# Patient Record
Sex: Male | Born: 1940 | Race: Black or African American | Hispanic: No | Marital: Married | Smoking: Current every day smoker
Health system: Southern US, Community
[De-identification: ages and names within clinical notes are randomized; demographics above are authoritative.]

## PROBLEM LIST (undated history)

## (undated) DIAGNOSIS — E7800 Pure hypercholesterolemia, unspecified: Secondary | ICD-10-CM

---

## 2009-04-09 ENCOUNTER — Inpatient Hospital Stay: Payer: Self-pay | Admitting: Specialist

## 2015-12-19 DIAGNOSIS — Z6832 Body mass index (BMI) 32.0-32.9, adult: Secondary | ICD-10-CM | POA: Diagnosis not present

## 2015-12-19 DIAGNOSIS — F17200 Nicotine dependence, unspecified, uncomplicated: Secondary | ICD-10-CM | POA: Diagnosis not present

## 2016-04-18 DIAGNOSIS — R42 Dizziness and giddiness: Secondary | ICD-10-CM | POA: Insufficient documentation

## 2016-04-18 DIAGNOSIS — R030 Elevated blood-pressure reading, without diagnosis of hypertension: Secondary | ICD-10-CM | POA: Insufficient documentation

## 2016-04-18 DIAGNOSIS — F17200 Nicotine dependence, unspecified, uncomplicated: Secondary | ICD-10-CM | POA: Diagnosis not present

## 2016-04-18 DIAGNOSIS — E669 Obesity, unspecified: Secondary | ICD-10-CM | POA: Diagnosis not present

## 2016-04-18 DIAGNOSIS — M6281 Muscle weakness (generalized): Secondary | ICD-10-CM | POA: Diagnosis not present

## 2016-05-01 DIAGNOSIS — E669 Obesity, unspecified: Secondary | ICD-10-CM | POA: Diagnosis not present

## 2016-05-01 DIAGNOSIS — F17200 Nicotine dependence, unspecified, uncomplicated: Secondary | ICD-10-CM | POA: Diagnosis not present

## 2016-05-01 DIAGNOSIS — R42 Dizziness and giddiness: Secondary | ICD-10-CM | POA: Diagnosis not present

## 2016-05-07 DIAGNOSIS — M6281 Muscle weakness (generalized): Secondary | ICD-10-CM | POA: Diagnosis not present

## 2016-05-07 DIAGNOSIS — R29898 Other symptoms and signs involving the musculoskeletal system: Secondary | ICD-10-CM | POA: Diagnosis not present

## 2016-05-07 DIAGNOSIS — R9089 Other abnormal findings on diagnostic imaging of central nervous system: Secondary | ICD-10-CM | POA: Diagnosis not present

## 2016-05-07 DIAGNOSIS — R9082 White matter disease, unspecified: Secondary | ICD-10-CM | POA: Diagnosis not present

## 2016-05-07 DIAGNOSIS — Z8673 Personal history of transient ischemic attack (TIA), and cerebral infarction without residual deficits: Secondary | ICD-10-CM | POA: Diagnosis not present

## 2016-05-09 DIAGNOSIS — R1310 Dysphagia, unspecified: Secondary | ICD-10-CM | POA: Diagnosis not present

## 2016-05-09 DIAGNOSIS — R7309 Other abnormal glucose: Secondary | ICD-10-CM | POA: Diagnosis not present

## 2016-05-09 DIAGNOSIS — R030 Elevated blood-pressure reading, without diagnosis of hypertension: Secondary | ICD-10-CM | POA: Diagnosis not present

## 2016-05-09 DIAGNOSIS — F17200 Nicotine dependence, unspecified, uncomplicated: Secondary | ICD-10-CM | POA: Diagnosis not present

## 2016-05-09 DIAGNOSIS — M6281 Muscle weakness (generalized): Secondary | ICD-10-CM | POA: Diagnosis not present

## 2016-07-29 ENCOUNTER — Observation Stay
Admission: EM | Admit: 2016-07-29 | Discharge: 2016-07-29 | Disposition: A | Discharge status: Home / Self Care | Payer: Medicare HMO | Attending: Internal Medicine | Admitting: Internal Medicine

## 2016-07-29 ENCOUNTER — Emergency Department: Payer: Medicare HMO

## 2016-07-29 ENCOUNTER — Encounter: Payer: Self-pay | Admitting: Emergency Medicine

## 2016-07-29 DIAGNOSIS — E7800 Pure hypercholesterolemia, unspecified: Secondary | ICD-10-CM | POA: Insufficient documentation

## 2016-07-29 DIAGNOSIS — I471 Supraventricular tachycardia: Secondary | ICD-10-CM | POA: Diagnosis not present

## 2016-07-29 DIAGNOSIS — F17210 Nicotine dependence, cigarettes, uncomplicated: Secondary | ICD-10-CM | POA: Insufficient documentation

## 2016-07-29 DIAGNOSIS — Z79899 Other long term (current) drug therapy: Secondary | ICD-10-CM | POA: Diagnosis not present

## 2016-07-29 DIAGNOSIS — E669 Obesity, unspecified: Secondary | ICD-10-CM | POA: Insufficient documentation

## 2016-07-29 DIAGNOSIS — Z823 Family history of stroke: Secondary | ICD-10-CM | POA: Diagnosis not present

## 2016-07-29 DIAGNOSIS — Z7982 Long term (current) use of aspirin: Secondary | ICD-10-CM | POA: Diagnosis not present

## 2016-07-29 DIAGNOSIS — D72829 Elevated white blood cell count, unspecified: Secondary | ICD-10-CM | POA: Diagnosis not present

## 2016-07-29 DIAGNOSIS — Z8249 Family history of ischemic heart disease and other diseases of the circulatory system: Secondary | ICD-10-CM | POA: Insufficient documentation

## 2016-07-29 DIAGNOSIS — R000 Tachycardia, unspecified: Secondary | ICD-10-CM | POA: Diagnosis not present

## 2016-07-29 DIAGNOSIS — D729 Disorder of white blood cells, unspecified: Secondary | ICD-10-CM | POA: Diagnosis not present

## 2016-07-29 DIAGNOSIS — I700 Atherosclerosis of aorta: Secondary | ICD-10-CM | POA: Diagnosis not present

## 2016-07-29 DIAGNOSIS — E860 Dehydration: Secondary | ICD-10-CM | POA: Insufficient documentation

## 2016-07-29 DIAGNOSIS — Z6833 Body mass index (BMI) 33.0-33.9, adult: Secondary | ICD-10-CM | POA: Insufficient documentation

## 2016-07-29 DIAGNOSIS — E785 Hyperlipidemia, unspecified: Secondary | ICD-10-CM | POA: Diagnosis not present

## 2016-07-29 DIAGNOSIS — Z809 Family history of malignant neoplasm, unspecified: Secondary | ICD-10-CM | POA: Diagnosis not present

## 2016-07-29 DIAGNOSIS — Z716 Tobacco abuse counseling: Secondary | ICD-10-CM | POA: Diagnosis not present

## 2016-07-29 HISTORY — DX: Pure hypercholesterolemia, unspecified: E78.00

## 2016-07-29 LAB — COMPREHENSIVE METABOLIC PANEL
ALT: 13 U/L — ABNORMAL LOW (ref 17–63)
ANION GAP: 11 (ref 5–15)
AST: 25 U/L (ref 15–41)
Albumin: 4 g/dL (ref 3.5–5.0)
Alkaline Phosphatase: 80 U/L (ref 38–126)
BUN: 18 mg/dL (ref 6–20)
CHLORIDE: 97 mmol/L — AB (ref 101–111)
CO2: 26 mmol/L (ref 22–32)
Calcium: 9.1 mg/dL (ref 8.9–10.3)
Creatinine, Ser: 1.58 mg/dL — ABNORMAL HIGH (ref 0.61–1.24)
GFR, EST AFRICAN AMERICAN: 48 mL/min — AB (ref >60)
GFR, EST NON AFRICAN AMERICAN: 41 mL/min — AB (ref >60)
Glucose, Bld: 170 mg/dL — ABNORMAL HIGH (ref 65–99)
POTASSIUM: 4.3 mmol/L (ref 3.5–5.1)
SODIUM: 134 mmol/L — AB (ref 135–145)
Total Bilirubin: 0.7 mg/dL (ref 0.3–1.2)
Total Protein: 7.4 g/dL (ref 6.5–8.1)

## 2016-07-29 LAB — CBC WITH DIFFERENTIAL/PLATELET
Basophils Absolute: 0.1 10*3/uL (ref 0–0.1)
Basophils Relative: 1 %
EOS ABS: 0.5 10*3/uL (ref 0–0.7)
EOS PCT: 3 %
HCT: 42.3 % (ref 40.0–52.0)
Hemoglobin: 13.9 g/dL (ref 13.0–18.0)
LYMPHS ABS: 2.9 10*3/uL (ref 1.0–3.6)
LYMPHS PCT: 16 %
MCH: 28.2 pg (ref 26.0–34.0)
MCHC: 32.7 g/dL (ref 32.0–36.0)
MCV: 86.2 fL (ref 80.0–100.0)
MONO ABS: 1 10*3/uL (ref 0.2–1.0)
Monocytes Relative: 6 %
Neutro Abs: 13.8 10*3/uL — ABNORMAL HIGH (ref 1.4–6.5)
Neutrophils Relative %: 74 %
PLATELETS: 285 10*3/uL (ref 150–440)
RBC: 4.91 MIL/uL (ref 4.40–5.90)
RDW: 15.6 % — AB (ref 11.5–14.5)
WBC: 18.4 10*3/uL — ABNORMAL HIGH (ref 3.8–10.6)

## 2016-07-29 LAB — LIPID PANEL
CHOL/HDL RATIO: 3.7 ratio
CHOLESTEROL: 137 mg/dL (ref 0–200)
HDL: 37 mg/dL — AB (ref >40)
LDL Cholesterol: 87 mg/dL (ref 0–99)
Triglycerides: 66 mg/dL (ref <150)
VLDL: 13 mg/dL (ref 0–40)

## 2016-07-29 LAB — MAGNESIUM: MAGNESIUM: 1.6 mg/dL — AB (ref 1.7–2.4)

## 2016-07-29 LAB — INFLUENZA PANEL BY PCR (TYPE A & B)
INFLAPCR: NEGATIVE
INFLBPCR: NEGATIVE

## 2016-07-29 LAB — TROPONIN I
TROPONIN I: 0.04 ng/mL — AB (ref <0.03)
Troponin I: 0.05 ng/mL (ref <0.03)

## 2016-07-29 LAB — TSH: TSH: 1.185 u[IU]/mL (ref 0.350–4.500)

## 2016-07-29 LAB — LACTIC ACID, PLASMA: LACTIC ACID, VENOUS: 1.5 mmol/L (ref 0.5–1.9)

## 2016-07-29 MED ORDER — METOPROLOL TARTRATE 5 MG/5ML IV SOLN
2.5000 mg | Freq: Once | INTRAVENOUS | Status: AC
  Administered 2016-07-29: 2.5 mg via INTRAVENOUS
  Filled 2016-07-29: qty 5

## 2016-07-29 MED ORDER — ASPIRIN EC 81 MG PO TBEC
81.0000 mg | DELAYED_RELEASE_TABLET | Freq: Every day | ORAL | Status: DC
  Administered 2016-07-29: 81 mg via ORAL
  Filled 2016-07-29: qty 1

## 2016-07-29 MED ORDER — SODIUM CHLORIDE 0.9 % IV SOLN: INTRAVENOUS | Status: DC

## 2016-07-29 MED ORDER — HEPARIN SODIUM (PORCINE) 5000 UNIT/ML IJ SOLN
5000.0000 [IU] | Freq: Three times a day (TID) | INTRAMUSCULAR | Status: DC
  Administered 2016-07-29: 5000 [IU] via SUBCUTANEOUS
  Filled 2016-07-29: qty 1

## 2016-07-29 MED ORDER — METOPROLOL TARTRATE 5 MG/5ML IV SOLN: 2.5000 mg | Freq: Once | INTRAVENOUS | Status: DC

## 2016-07-29 MED ORDER — ADENOSINE 6 MG/2ML IV SOLN
6.0000 mg | Freq: Once | INTRAVENOUS | Status: DC
Start: 2016-07-29 — End: 2016-07-29

## 2016-07-29 MED ORDER — SODIUM CHLORIDE 0.9 % IV SOLN
Freq: Once | INTRAVENOUS | Status: AC
Start: 2016-07-29 — End: 2016-07-29
  Administered 2016-07-29: 07:00:00 via INTRAVENOUS

## 2016-07-29 MED ORDER — ATORVASTATIN CALCIUM 20 MG PO TABS
20.0000 mg | ORAL_TABLET | Freq: Every day | ORAL | Status: DC
  Administered 2016-07-29: 20 mg via ORAL
  Filled 2016-07-29 (×2): qty 1

## 2016-07-29 MED ORDER — SODIUM CHLORIDE 0.9% FLUSH: 3.0000 mL | Freq: Two times a day (BID) | INTRAVENOUS | Status: DC

## 2016-07-29 MED ORDER — METOPROLOL TARTRATE 25 MG PO TABS: 12.5000 mg | ORAL_TABLET | Freq: Two times a day (BID) | ORAL | 0 refills | Status: AC

## 2016-07-29 MED ORDER — SODIUM CHLORIDE 0.9 % IV BOLUS (SEPSIS)
1000.0000 mL | Freq: Once | INTRAVENOUS | Status: AC
  Administered 2016-07-29: 1000 mL via INTRAVENOUS

## 2016-07-29 MED ORDER — MAGNESIUM SULFATE 2 GM/50ML IV SOLN
2.0000 g | Freq: Once | INTRAVENOUS | Status: AC
  Administered 2016-07-29: 2 g via INTRAVENOUS
  Filled 2016-07-29: qty 50

## 2016-07-29 NOTE — ED Provider Notes (Signed)
[facility] Emergency Department Provider Note    First MD Initiated Contact with Patient 07/29/16 0405     (approximate)  I have reviewed the triage vital signs and the nursing notes.   HISTORY  Chief Complaint Dizziness    HPI John Vang is a 76 y.o. male who presents with 3 hours of indigestion and dizziness. Patient arrives to the ER pale and mottled. Heart rate found to be in the 150s. He shouldn't take him back emergently to resuscitation bay. Had he had normal blood pressure and it was appropriately mentating but did appear poorly perfused. While obtaining IV access I did attempt left carotid massage which seemed to convert the patient from an SVT to a rapid tachycardia with prolonged PR interval.  He denies any chest pain. States his only symptom has been indigestion. No nausea or vomiting.   Past Medical History:  Diagnosis Date  . High cholesterol    History reviewed. No pertinent family history. History reviewed. No pertinent surgical history. There are no active problems to display for this patient.     Prior to Admission medications   Medication Sig Start Date End Date Taking? Authorizing Provider  aspirin EC 81 MG tablet Take 81 mg by mouth daily.   Yes Historical Provider, MD    Allergies Patient has no known allergies.    Social History Social History  Substance Use Topics  . Smoking status: Current Every Day Smoker    Packs/day: 1.00    Years: 50.00    Types: Cigarettes  . Smokeless tobacco: Never Used  . Alcohol use No    Review of Systems Patient denies headaches, rhinorrhea, blurry vision, numbness, shortness of breath, chest pain, edema, cough, abdominal pain, nausea, vomiting, diarrhea, dysuria, fevers, rashes or hallucinations unless otherwise stated above in HPI. ____________________________________________   PHYSICAL EXAM:  VITAL SIGNS: Vitals:   07/29/16 0359 07/29/16 0408  BP:  118/64  Pulse: (!)  153 (!) 145  Resp:  (!) 22  Temp:  97.4 F (36.3 C)    Constitutional: Alert and oriented. ill appearing and in moderate distress. Eyes: Conjunctivae are normal. PERRL. EOMI. Head: Atraumatic. Nose: No congestion/rhinnorhea. Mouth/Throat: Mucous membranes are moist.  Oropharynx non-erythematous. Neck: No stridor. Painless ROM. No cervical spine tenderness to palpation Hematological/Lymphatic/Immunilogical: No cervical lymphadenopathy. Cardiovascular: tachycardic, poorly perfused Grossly normal heart sounds.  Respiratory: Normal respiratory effort.  No retractions. Lungs CTAB. Gastrointestinal: Soft and nontender. No distention. No abdominal bruits. No CVA tenderness. Genitourinary:  Musculoskeletal: No lower extremity tenderness nor edema.  No joint effusions. Neurologic:  Normal speech and language. No gross focal neurologic deficits are appreciated. No gait instability. Skin:  Skin is warm, dry and intact. No rash noted. Psychiatric: Mood and affect are normal. Speech and behavior are normal.  ____________________________________________   LABS (all labs ordered are listed, but only abnormal results are displayed)  Results for orders placed or performed during the hospital encounter of 07/29/16 (from the past 24 hour(s))  CBC with Differential/Platelet     Status: Abnormal   Collection Time: 07/29/16  4:21 AM  Result Value Ref Range   WBC 18.4 (H) 3.8 - 10.6 K/uL   RBC 4.91 4.40 - 5.90 MIL/uL   Hemoglobin 13.9 13.0 - 18.0 g/dL   HCT 42.3 40.0 - 52.0 %   MCV 86.2 80.0 - 100.0 fL   MCH 28.2 26.0 - 34.0 pg   MCHC 32.7 32.0 - 36.0 g/dL   RDW 15.6 (H) 11.5 -  14.5 %   Platelets 285 150 - 440 K/uL   Neutrophils Relative % 74 %   Neutro Abs 13.8 (H) 1.4 - 6.5 K/uL   Lymphocytes Relative 16 %   Lymphs Abs 2.9 1.0 - 3.6 K/uL   Monocytes Relative 6 %   Monocytes Absolute 1.0 0.2 - 1.0 K/uL   Eosinophils Relative 3 %   Eosinophils Absolute 0.5 0 - 0.7 K/uL   Basophils Relative 1  %   Basophils Absolute 0.1 0 - 0.1 K/uL  Comprehensive metabolic panel     Status: Abnormal   Collection Time: 07/29/16  4:21 AM  Result Value Ref Range   Sodium 134 (L) 135 - 145 mmol/L   Potassium 4.3 3.5 - 5.1 mmol/L   Chloride 97 (L) 101 - 111 mmol/L   CO2 26 22 - 32 mmol/L   Glucose, Bld 170 (H) 65 - 99 mg/dL   BUN 18 6 - 20 mg/dL   Creatinine, Ser 1.58 (H) 0.61 - 1.24 mg/dL   Calcium 9.1 8.9 - 10.3 mg/dL   Total Protein 7.4 6.5 - 8.1 g/dL   Albumin 4.0 3.5 - 5.0 g/dL   AST 25 15 - 41 U/L   ALT 13 (L) 17 - 63 U/L   Alkaline Phosphatase 80 38 - 126 U/L   Total Bilirubin 0.7 0.3 - 1.2 mg/dL   GFR calc non Af Amer 41 (L) >60 mL/min   GFR calc Af Amer 48 (L) >60 mL/min   Anion gap 11 5 - 15  Troponin I     Status: None   Collection Time: 07/29/16  4:21 AM  Result Value Ref Range   Troponin I <0.03 <0.03 ng/mL  Magnesium     Status: Abnormal   Collection Time: 07/29/16  4:21 AM  Result Value Ref Range   Magnesium 1.6 (L) 1.7 - 2.4 mg/dL  Lactic acid, plasma     Status: None   Collection Time: 07/29/16  5:21 AM  Result Value Ref Range   Lactic Acid, Venous 1.5 0.5 - 1.9 mmol/L  Influenza panel by PCR (type A & B)     Status: None   Collection Time: 07/29/16  5:22 AM  Result Value Ref Range   Influenza A By PCR NEGATIVE NEGATIVE   Influenza B By PCR NEGATIVE NEGATIVE   ____________________________________________  EKG My review and personal interpretation at Time: 4:00   Indication: tachycardia  Rate: 150  Rhythm: svt Axis: left Other:  Irregularly irregular rhythm, non specific st changes likely rate dependent   My review and personal interpretation at Time: 4:16   Indication: tachycardia Rate: 100  Rhythm: sinus Axis: left Other: no stemi, prolonged PR interval, prolonged qt   My review and personal interpretation at Time: 04:22   Indication: tachycardia  Rate: 95  Rhythm: sinus dysrhythmia Axis: normal Other: prolonged pr, borderling prolonged  qt  ____________________________________________  RADIOLOGY  I personally reviewed all radiographic images ordered to evaluate for the above acute complaints and reviewed radiology reports and findings.  These findings were personally discussed with the patient.  Please see medical record for radiology report.  ____________________________________________   PROCEDURES  Procedure(s) performed:  Procedures    Critical Care performed: yes CRITICAL CARE Performed by: Merlyn Lot   Total critical care time: 40 minutes  Critical care time was exclusive of separately billable procedures and treating other patients.  Critical care was necessary to treat or prevent imminent or life-threatening deterioration.  Critical care was time spent  personally by me on the following activities: development of treatment plan with patient and/or surrogate as well as nursing, discussions with consultants, evaluation of patient's response to treatment, examination of patient, obtaining history from patient or surrogate, ordering and performing treatments and interventions, ordering and review of laboratory studies, ordering and review of radiographic studies, pulse oximetry and re-evaluation of patient's condition.  ____________________________________________   INITIAL IMPRESSION / ASSESSMENT AND PLAN / ED COURSE  Pertinent labs & imaging results that were available during my care of the patient were reviewed by me and considered in my medical decision making (see chart for details).  DDX: O abnormality, dehydration, sepsis, ACS, dysrhythmia   John Vang is a 76 y.o. who presents to the ED with SVT and critically ill appearing as described above.   Patient presented initially with SVT, narrow complex. Previous EKGs did not show any evidence of WPW. Patient without any history of A. fib or dysrhythmia. Patient surprisingly responded to carotid massage as well as IV fluids and Lopressor. Does  seem to be having some abnormal underlying sinus ectopic rhythm. Blood pressure otherwise normalizing of patient's much improved with rate control. Magnesium is low which was repleted. EKG showed no evidence of ST elevation MI. Troponin negative. Markedly elevated leukocytosis could be secondary to poor perfusion in the setting of dysrhythmia. No evidence of fever. Based on his age and no history of dysrhythmia with inadequate cardiology follow-up but do feel the patient would benefit from admission hospital for telemetry monitoring and further evaluation. Have discussed with the patient and available family all diagnostics and treatments performed thus far and all questions were answered to the best of my ability. The patient demonstrates understanding and agreement with plan.      ____________________________________________   FINAL CLINICAL IMPRESSION(S) / ED DIAGNOSES  Final diagnoses:  SVT (supraventricular tachycardia) (HCC)  Hypomagnesemia  Dehydration      NEW MEDICATIONS STARTED DURING THIS VISIT:  New Prescriptions   No medications on file     Note:  This document was prepared using Dragon voice recognition software and may include unintentional dictation errors.    Merlyn Lot, MD 07/29/16 [phone]

## 2016-07-29 NOTE — H&P (Signed)
[place] [facility] [place] NAME: John Vang    MR#:  [id number]  DATE OF BIRTH:  1940-11-25  DATE OF ADMISSION:  07/29/2016  PRIMARY CARE PHYSICIAN: Pcp Not In System   REQUESTING/REFERRING PHYSICIAN: Dr.Robinson  CHIEF COMPLAINT:   Chief Complaint  Patient presents with  . Dizziness    HISTORY OF PRESENT ILLNESS: John Vang  is a 76 y.o. male with a known history of Hypercholesterolemia, has been feeling palpitations on and off for last few month. This was not related to any activities and was getting resolved by taking some rest all the time. It was never associated with chest pain. Since last night patient started having this palpitation feeling along with some shortness of breath and sweating and it was not relieved by any efforts up until 3 to 4 am in the morning so he decided to come to emergency room. In ER he was noted to have supraventricular tachycardia which responded to metoprolol injection. Patient was also noted to have slightly higher creatinine, we don't have any baseline labs to compare with and his white blood cell count was high without any clear source of infection. He also does not have any cardiologist as outpatient to follow-up with so ER physician decided in pt's best interest to keep the patient for observation and do initial workup to rule out any major cardiac abnormalities. Patient denied any symptoms suggestive of any kind of infection.  PAST MEDICAL HISTORY:   Past Medical History:  Diagnosis Date  . High cholesterol     PAST SURGICAL HISTORY: History reviewed. No pertinent surgical history.  SOCIAL HISTORY:  Social History  Substance Use Topics  . Smoking status: Current Every Day Smoker    Packs/day: 1.00    Years: 50.00    Types: Cigarettes  . Smokeless tobacco: Never Used  . Alcohol use No    FAMILY HISTORY:  Family History  Problem Relation Age of Onset  . CAD Father   . Cancer Sister   . Stroke  Brother     DRUG ALLERGIES: No Known Allergies  REVIEW OF SYSTEMS:   CONSTITUTIONAL: No fever, fatigue or weakness.  EYES: No blurred or double vision.  EARS, NOSE, AND THROAT: No tinnitus or ear pain.  RESPIRATORY: No cough, shortness of breath, wheezing or hemoptysis.  CARDIOVASCULAR: No chest pain, orthopnea, edema. Had feeling of palpitation. GASTROINTESTINAL: No nausea, vomiting, diarrhea or abdominal pain.  GENITOURINARY: No dysuria, hematuria.  ENDOCRINE: No polyuria, nocturia,  HEMATOLOGY: No anemia, easy bruising or bleeding SKIN: No rash or lesion. MUSCULOSKELETAL: No joint pain or arthritis.   NEUROLOGIC: No tingling, numbness, weakness.  PSYCHIATRY: No anxiety or depression.   MEDICATIONS AT HOME:  Prior to Admission medications   Medication Sig Start Date End Date Taking? Authorizing Provider  aspirin EC 81 MG tablet Take 81 mg by mouth daily.   Yes Historical Provider, MD  atorvastatin (LIPITOR) 20 MG tablet Take 1 tablet by mouth daily. 07/04/16  Yes Historical Provider, MD      PHYSICAL EXAMINATION:   VITAL SIGNS: Blood pressure 122/64, pulse 72, temperature 97.4 F (36.3 C), temperature source Oral, resp. rate 17, height 5\' 5"  (1.651 m), weight 95.3 kg (210 lb), SpO2 97 %.  GENERAL:  76 y.o.-year-old patient lying in the bed with no acute distress.  EYES: Pupils equal, round, reactive to light and accommodation. No scleral icterus. Extraocular muscles intact.  HEENT: Head atraumatic, normocephalic. Oropharynx and nasopharynx clear.  NECK:  Supple, no jugular venous distention. No thyroid enlargement, no tenderness.  LUNGS: Normal breath sounds bilaterally, no wheezing, rales,rhonchi or crepitation. No use of accessory muscles of respiration.  CARDIOVASCULAR: S1, S2 normal. No murmurs, rubs, or gallops.  ABDOMEN: Soft, nontender, nondistended. Bowel sounds present. No organomegaly or mass.  EXTREMITIES: No pedal edema, cyanosis, or clubbing.  NEUROLOGIC:  Cranial nerves II through XII are intact. Muscle strength 5/5 in all extremities. Sensation intact. Gait not checked.  PSYCHIATRIC: The patient is alert and oriented x 3.  SKIN: No obvious rash, lesion, or ulcer.   LABORATORY PANEL:   CBC  Recent Labs Lab 07/29/16 0421  WBC 18.4*  HGB 13.9  HCT 42.3  PLT 285  MCV 86.2  MCH 28.2  MCHC 32.7  RDW 15.6*  LYMPHSABS 2.9  MONOABS 1.0  EOSABS 0.5  BASOSABS 0.1   ------------------------------------------------------------------------------------------------------------------  Chemistries   Recent Labs Lab 07/29/16 0421  NA 134*  K 4.3  CL 97*  CO2 26  GLUCOSE 170*  BUN 18  CREATININE 1.58*  CALCIUM 9.1  MG 1.6*  AST 25  ALT 13*  ALKPHOS 80  BILITOT 0.7   ------------------------------------------------------------------------------------------------------------------ estimated creatinine clearance is 42.9 mL/min (by C-G formula based on SCr of 1.58 mg/dL (H)). ------------------------------------------------------------------------------------------------------------------ No results for input(s): TSH, T4TOTAL, T3FREE, THYROIDAB in the last 72 hours.  Invalid input(s): FREET3   Coagulation profile No results for input(s): INR, PROTIME in the last 168 hours. ------------------------------------------------------------------------------------------------------------------- No results for input(s): DDIMER in the last 72 hours. -------------------------------------------------------------------------------------------------------------------  Cardiac Enzymes  Recent Labs Lab 07/29/16 0421  TROPONINI <0.03   ------------------------------------------------------------------------------------------------------------------ Invalid input(s): POCBNP  ---------------------------------------------------------------------------------------------------------------  Urinalysis No results found for: COLORURINE,  APPEARANCEUR, LABSPEC, PHURINE, GLUCOSEU, HGBUR, BILIRUBINUR, KETONESUR, PROTEINUR, UROBILINOGEN, NITRITE, LEUKOCYTESUR   RADIOLOGY: Dg Chest Portable 1 View  Result Date: 07/29/2016 CLINICAL DATA:  Tachycardia.  SVT. EXAM: PORTABLE CHEST 1 VIEW COMPARISON:  04/09/2009 FINDINGS: Multiple overlying monitoring devices project over the left chest partially obscuring evaluation. Heart is at the upper limits of normal in size. Mediastinal contours are normal. Atherosclerosis of the thoracic aorta. Lingular scarring. No pulmonary edema. No focal airspace disease. No evidence of pleural fluid. No pneumothorax. No acute osseous abnormalities are seen. IMPRESSION: Heart at the upper limits of normal in size. Thoracic aortic atherosclerosis. Electronically Signed   By: Jeb Levering M.D.   On: 07/29/2016 04:53    EKG: Orders placed or performed during the hospital encounter of 07/29/16  . EKG 12-Lead  . EKG 12-Lead  . ED EKG  . ED EKG  . EKG 12-Lead  . EKG 12-Lead  . EKG 12-Lead  . EKG 12-Lead    As per initial EKG from ER patient has supraventricular tachycardia with ventricular rate up to 150.  IMPRESSION AND PLAN:  * Supraventricular tachycardia   Magnesium is slightly low which is replaced by ER.   Check TSH and get echocardiogram.   Monitor on telemetry and follow serial troponin levels.   Cardiology consultation for further help.   Currently in normal sinus rhythm so I will not start on any medications.    * Elevated white blood cell count   This may be reactive, currently there is no source of infection.  * Hyperlipidemia   Continue statin, check lipid panel   Check hemoglobin A1c.  * Slightly elevated creatinine level   We don't have any baseline levels to compare with.   Monitor. IV fluids.  * Active smoking   Counseled to quit smoking for  4 minutes and offered nicotine patch.   All the records are reviewed and case discussed with ED provider. Management plans  discussed with the patient, family and they are in agreement.  CODE STATUS: Full code Code Status History    This patient does not have a recorded code status. Please follow your organizational policy for patients in this situation.     Patient's wife was present in the room during my visit with him.  TOTAL TIME TAKING CARE OF THIS PATIENT: 50 minutes.    Vaughan Basta M.D on 07/29/2016   Between 7am to 6pm - Pager - [phone]  After 6pm go to [email or web address] - password [email or web address] [facility]  Office  [phone]  CC: Primary care physician; Pcp Not In System   Note: This dictation was prepared with Dragon dictation along with smaller phrase technology. Any transcriptional errors that result from this process are unintentional.

## 2016-07-29 NOTE — Discharge Instructions (Signed)
Follow with cardiology clinic in 1-2 weeks.

## 2016-07-29 NOTE — Discharge Summary (Signed)
[facility] NAME: John Vang    MR#:  [id number]  DATE OF BIRTH:  02-07-1941  DATE OF ADMISSION:  07/29/2016 ADMITTING PHYSICIAN: Vaughan Basta, MD  DATE OF DISCHARGE: 07/29/2016  PRIMARY CARE PHYSICIAN: Pcp Not In System    ADMISSION DIAGNOSIS:  Dehydration [E86.0] Hypomagnesemia [E83.42] SVT (supraventricular tachycardia) (HCC) [I47.1]  DISCHARGE DIAGNOSIS:  Principal Problem:   SVT (supraventricular tachycardia) ([place])   SECONDARY DIAGNOSIS:   Past Medical History:  Diagnosis Date  . High cholesterol     HOSPITAL COURSE:   Vision came with SVT, monitored on telemetry and serial troponin remained negative. Patient was converted to sinus rhythm by injection of metoprolol in ER. He remained asymptomatic.  Seen by cardiologist in hospital and he suggested to discharge him with oral metoprolol and have follow-up in the office for echocardiogram. Checked his TSH which was normal.  DISCHARGE CONDITIONS:   Stable.  CONSULTS OBTAINED:  Treatment Team:  Evans Lance, MD  DRUG ALLERGIES:  No Known Allergies  DISCHARGE MEDICATIONS:   Current Discharge Medication List    START taking these medications   Details  metoprolol tartrate (LOPRESSOR) 25 MG tablet Take 0.5 tablets (12.5 mg total) by mouth 2 (two) times daily. Qty: 60 tablet, Refills: 0      CONTINUE these medications which have NOT CHANGED   Details  aspirin EC 81 MG tablet Take 81 mg by mouth daily.    atorvastatin (LIPITOR) 20 MG tablet Take 1 tablet by mouth daily.         DISCHARGE INSTRUCTIONS:   Follow with cardiology clinic in 1-2 weeks.  If you experience worsening of your admission symptoms, develop shortness of breath, life threatening emergency, suicidal or homicidal thoughts you must seek medical attention immediately by calling 911 or calling your MD immediately  if symptoms less severe.  You Must read complete  instructions/literature along with all the possible adverse reactions/side effects for all the Medicines you take and that have been prescribed to you. Take any new Medicines after you have completely understood and accept all the possible adverse reactions/side effects.   Please note  You were cared for by a hospitalist during your hospital stay. If you have any questions about your discharge medications or the care you received while you were in the hospital after you are discharged, you can call the unit and asked to speak with the hospitalist on call if the hospitalist that took care of you is not available. Once you are discharged, your primary care physician will handle any further medical issues. Please note that NO REFILLS for any discharge medications will be authorized once you are discharged, as it is imperative that you return to your primary care physician (or establish a relationship with a primary care physician if you do not have one) for your aftercare needs so that they can reassess your need for medications and monitor your lab values.    Today   CHIEF COMPLAINT:   Chief Complaint  Patient presents with  . Dizziness    HISTORY OF PRESENT ILLNESS:  John Vang  is a 76 y.o. male with a known history of Hypercholesterolemia, has been feeling palpitations on and off for last few month. This was not related to any activities and was getting resolved by taking some rest all the time. It was never associated with chest pain. Since last night patient started having this palpitation feeling along with some shortness of  breath and sweating and it was not relieved by any efforts up until 3 to 4 am in the morning so he decided to come to emergency room. In ER he was noted to have supraventricular tachycardia which responded to metoprolol injection. Patient was also noted to have slightly higher creatinine, we don't have any baseline labs to compare with and his white blood cell count  was high without any clear source of infection. He also does not have any cardiologist as outpatient to follow-up with so ER physician decided in pt's best interest to keep the patient for observation and do initial workup to rule out any major cardiac abnormalities. Patient denied any symptoms suggestive of any kind of infection.   VITAL SIGNS:  Blood pressure 123/60, pulse 73, temperature 98.4 F (36.9 C), temperature source Oral, resp. rate 20, height 5\' 7"  (1.702 m), weight 96.2 kg (212 lb), SpO2 99 %.  I/O:   Intake/Output Summary (Last 24 hours) at 07/29/16 1410 Last data filed at 07/29/16 1359  Gross per 24 hour  Intake              240 ml  Output              550 ml  Net             -310 ml    PHYSICAL EXAMINATION:  GENERAL:  76 y.o.-year-old patient lying in the bed with no acute distress.  EYES: Pupils equal, round, reactive to light and accommodation. No scleral icterus. Extraocular muscles intact.  HEENT: Head atraumatic, normocephalic. Oropharynx and nasopharynx clear.  NECK:  Supple, no jugular venous distention. No thyroid enlargement, no tenderness.  LUNGS: Normal breath sounds bilaterally, no wheezing, rales,rhonchi or crepitation. No use of accessory muscles of respiration.  CARDIOVASCULAR: S1, S2 normal. No murmurs, rubs, or gallops.  ABDOMEN: Soft, non-tender, non-distended. Bowel sounds present. No organomegaly or mass.  EXTREMITIES: No pedal edema, cyanosis, or clubbing.  NEUROLOGIC: Cranial nerves II through XII are intact. Muscle strength 5/5 in all extremities. Sensation intact. Gait not checked.  PSYCHIATRIC: The patient is alert and oriented x 3.  SKIN: No obvious rash, lesion, or ulcer.   DATA REVIEW:   CBC  Recent Labs Lab 07/29/16 0421  WBC 18.4*  HGB 13.9  HCT 42.3  PLT 285    Chemistries   Recent Labs Lab 07/29/16 0421  NA 134*  K 4.3  CL 97*  CO2 26  GLUCOSE 170*  BUN 18  CREATININE 1.58*  CALCIUM 9.1  MG 1.6*  AST 25  ALT  13*  ALKPHOS 80  BILITOT 0.7    Cardiac Enzymes  Recent Labs Lab 07/29/16 1140  TROPONINI 0.05*    Microbiology Results  No results found for this or any previous visit.  RADIOLOGY:  Dg Chest Portable 1 View  Result Date: 07/29/2016 CLINICAL DATA:  Tachycardia.  SVT. EXAM: PORTABLE CHEST 1 VIEW COMPARISON:  04/09/2009 FINDINGS: Multiple overlying monitoring devices project over the left chest partially obscuring evaluation. Heart is at the upper limits of normal in size. Mediastinal contours are normal. Atherosclerosis of the thoracic aorta. Lingular scarring. No pulmonary edema. No focal airspace disease. No evidence of pleural fluid. No pneumothorax. No acute osseous abnormalities are seen. IMPRESSION: Heart at the upper limits of normal in size. Thoracic aortic atherosclerosis. Electronically Signed   By: Jeb Levering M.D.   On: 07/29/2016 04:53    EKG:   Orders placed or performed during the hospital encounter of 07/29/16  .  EKG 12-Lead  . EKG 12-Lead  . ED EKG  . ED EKG  . EKG 12-Lead  . EKG 12-Lead  . EKG 12-Lead  . EKG 12-Lead      Management plans discussed with the patient, family and they are in agreement.  CODE STATUS:     Code Status Orders        Start     Ordered   07/29/16 0843  Full code  Continuous     07/29/16 0842    Code Status History    Date Active Date Inactive Code Status Order ID Comments User Context   This patient has a current code status but no historical code status.      TOTAL TIME TAKING CARE OF THIS PATIENT: 35 minutes.    Vaughan Basta M.D on 07/29/2016 at 2:10 PM  Between 7am to 6pm - Pager - [phone]  After 6pm go to [email or web address] - password [email or web address] [facility]  Office  [phone]  CC: Primary care physician; Pcp Not In System   Note: This dictation was prepared with Dragon dictation along with smaller phrase technology. Any transcriptional errors that result from this  process are unintentional.

## 2016-07-29 NOTE — ED Triage Notes (Signed)
Pt reports 2 hours of feeling "woozy"; denies nausea; denies dizziness, says only feeling some lightheadedness; denies headache; denies chest pain; denies visual changes, difficulty swallowing, difficulty talking and difficulty walking; ambulatory with steady gait; talking in complete coherent sentences

## 2016-07-29 NOTE — Consult Note (Signed)
  Reason for Consult:SVT and chest pressure  Referring Physician: Dr. Lindwood Coke is an 76 y.o. male.   HPI: The patient is a 76 yo man who had had a h/o SVT in the past, usually short lived who presented with sustained palpitations and was found to have a narrow complex short RP tachycardia at a rate of 155/min. He was treated with CSM and went to what appears to be sinus tachycardia. He apparently looked bad. He feels better. Troponin minimally elevated. No syncope. He has minimal chest pressure with his arrhythmias. He states that he usually has episodes which only last for a couple of minutes. He is not on any medical therapy.   PMH: Past Medical History:  Diagnosis Date  . High cholesterol     PSHX:History reviewed. No pertinent surgical history.  FAMHX: Family History  Problem Relation Age of Onset  . CAD Father   . Cancer Sister   . Stroke Brother     Social History:  reports that he has been smoking Cigarettes.  He has a 50.00 pack-year smoking history. He has never used smokeless tobacco. He reports that he does not drink alcohol. His drug history is not on file.  Allergies: No Known Allergies  Medications: I have reviewed the patient's current medications.  Dg Chest Portable 1 View  Result Date: 07/29/2016 CLINICAL DATA:  Tachycardia.  SVT. EXAM: PORTABLE CHEST 1 VIEW COMPARISON:  04/09/2009 FINDINGS: Multiple overlying monitoring devices project over the left chest partially obscuring evaluation. Heart is at the upper limits of normal in size. Mediastinal contours are normal. Atherosclerosis of the thoracic aorta. Lingular scarring. No pulmonary edema. No focal airspace disease. No evidence of pleural fluid. No pneumothorax. No acute osseous abnormalities are seen. IMPRESSION: Heart at the upper limits of normal in size. Thoracic aortic atherosclerosis. Electronically Signed   By: Jeb Levering M.D.   On: 07/29/2016 04:53    ROS  As stated in the HPI and  negative for all other systems.  Physical Exam  Vitals:Blood pressure 123/60, pulse 73, temperature 98.4 F (36.9 C), temperature source Oral, resp. rate 20, height 5\' 7"  (1.702 m), weight 212 lb (96.2 kg), SpO2 99 %.  Well appearing 76 yo man, NAD HEENT: Unremarkable Neck:  No JVD, no thyromegally Lymphatics:  No adenopathy Back:  No CVA tenderness Lungs:  Clear with no wheezes, rales, or rhonchi HEART:  Regular rate rhythm, no murmurs, no rubs, no clicks Abd:  Flat, positive bowel sounds, no organomegally, no rebound, no guarding Ext:  2 plus pulses, no edema, no cyanosis, no clubbing Skin:  No rashes no nodules Neuro:  CN II through XII intact, motor grossly intact  ECG - short RP tachycardia  Tele - nsr  Assessment/Plan: 1. SVT - he has reverted back ton NSR. I have discussed the treatment options with the patient and I would like to start metoprolol 25 mg twice daily. He can followup with me in [facility] or Dr. Renaldo Reel in our [facility] office. 2. Sob - an echo either while in hospital or as an outpatient is reasonable. I do not think he needs to stay overnight now that he feels better. 3. Obesity - wt loss is recommended 4. Dyslipidemia - he will continue his statin therapy  Suella Grove 07/29/2016, 12:40 PM

## 2016-07-29 NOTE — Progress Notes (Signed)
Pt ambulated at length in [place] and tolerated it well. He is to be discharged today. Iv and tele removed. disch instructions prescrip and pill splitter given to pt. disch via w.c. Accompanied by family member.

## 2016-07-31 LAB — HEMOGLOBIN A1C
HEMOGLOBIN A1C: 6.1 % — AB (ref 4.8–5.6)
MEAN PLASMA GLUCOSE: 128 mg/dL

## 2016-08-08 DIAGNOSIS — Z0000 Encounter for general adult medical examination without abnormal findings: Secondary | ICD-10-CM | POA: Diagnosis not present

## 2016-08-08 DIAGNOSIS — Z1389 Encounter for screening for other disorder: Secondary | ICD-10-CM | POA: Diagnosis not present

## 2016-08-08 DIAGNOSIS — I471 Supraventricular tachycardia: Secondary | ICD-10-CM | POA: Diagnosis not present

## 2016-08-09 ENCOUNTER — Other Ambulatory Visit: Payer: Self-pay | Admitting: Obstetrics and Gynecology

## 2016-08-09 DIAGNOSIS — Z8249 Family history of ischemic heart disease and other diseases of the circulatory system: Secondary | ICD-10-CM

## 2016-08-15 ENCOUNTER — Ambulatory Visit: Payer: Medicare HMO

## 2016-09-04 ENCOUNTER — Other Ambulatory Visit: Payer: Medicare HMO

## 2016-09-10 DIAGNOSIS — R42 Dizziness and giddiness: Secondary | ICD-10-CM | POA: Diagnosis not present

## 2016-09-10 DIAGNOSIS — I10 Essential (primary) hypertension: Secondary | ICD-10-CM | POA: Diagnosis not present

## 2016-09-10 DIAGNOSIS — R0602 Shortness of breath: Secondary | ICD-10-CM | POA: Diagnosis not present

## 2016-09-10 DIAGNOSIS — F17200 Nicotine dependence, unspecified, uncomplicated: Secondary | ICD-10-CM | POA: Diagnosis not present

## 2016-09-10 DIAGNOSIS — E669 Obesity, unspecified: Secondary | ICD-10-CM | POA: Diagnosis not present

## 2016-09-10 DIAGNOSIS — R011 Cardiac murmur, unspecified: Secondary | ICD-10-CM | POA: Diagnosis not present

## 2016-09-10 DIAGNOSIS — R000 Tachycardia, unspecified: Secondary | ICD-10-CM | POA: Diagnosis not present

## 2016-09-10 DIAGNOSIS — E785 Hyperlipidemia, unspecified: Secondary | ICD-10-CM | POA: Diagnosis not present

## 2016-09-17 ENCOUNTER — Ambulatory Visit
Admission: RE | Admit: 2016-09-17 | Discharge: 2016-09-17 | Disposition: A | Discharge status: Home / Self Care | Payer: Medicare HMO | Source: Ambulatory Visit | Attending: Obstetrics and Gynecology | Admitting: Obstetrics and Gynecology

## 2016-09-17 DIAGNOSIS — Z8249 Family history of ischemic heart disease and other diseases of the circulatory system: Secondary | ICD-10-CM

## 2016-09-17 DIAGNOSIS — I5189 Other ill-defined heart diseases: Secondary | ICD-10-CM | POA: Diagnosis not present

## 2016-09-17 DIAGNOSIS — I340 Nonrheumatic mitral (valve) insufficiency: Secondary | ICD-10-CM | POA: Insufficient documentation

## 2016-09-17 NOTE — Progress Notes (Signed)
*  PRELIMINARY RESULTS* Echocardiogram 2D Echocardiogram has been performed.  John Vang, Safi 09/17/2016, 10:23 AM

## 2016-09-20 DIAGNOSIS — R42 Dizziness and giddiness: Secondary | ICD-10-CM | POA: Diagnosis not present

## 2016-10-25 DIAGNOSIS — R0602 Shortness of breath: Secondary | ICD-10-CM | POA: Diagnosis not present

## 2016-10-25 DIAGNOSIS — R011 Cardiac murmur, unspecified: Secondary | ICD-10-CM | POA: Diagnosis not present

## 2016-10-25 DIAGNOSIS — R42 Dizziness and giddiness: Secondary | ICD-10-CM | POA: Diagnosis not present

## 2017-01-15 DIAGNOSIS — Z111 Encounter for screening for respiratory tuberculosis: Secondary | ICD-10-CM | POA: Diagnosis not present

## 2017-01-18 ENCOUNTER — Other Ambulatory Visit: Payer: Self-pay | Admitting: Family Medicine

## 2017-01-18 ENCOUNTER — Ambulatory Visit
Admission: RE | Admit: 2017-01-18 | Discharge: 2017-01-18 | Disposition: A | Discharge status: Home / Self Care | Payer: Medicare HMO | Source: Ambulatory Visit | Attending: Family Medicine | Admitting: Family Medicine

## 2017-01-18 DIAGNOSIS — R7611 Nonspecific reaction to tuberculin skin test without active tuberculosis: Secondary | ICD-10-CM | POA: Insufficient documentation

## 2017-02-05 DIAGNOSIS — R7309 Other abnormal glucose: Secondary | ICD-10-CM | POA: Diagnosis not present

## 2017-02-05 DIAGNOSIS — R911 Solitary pulmonary nodule: Secondary | ICD-10-CM | POA: Diagnosis not present

## 2017-02-05 DIAGNOSIS — F17200 Nicotine dependence, unspecified, uncomplicated: Secondary | ICD-10-CM | POA: Diagnosis not present

## 2017-02-05 DIAGNOSIS — R7611 Nonspecific reaction to tuberculin skin test without active tuberculosis: Secondary | ICD-10-CM | POA: Diagnosis not present

## 2017-02-06 ENCOUNTER — Other Ambulatory Visit: Payer: Self-pay | Admitting: Obstetrics and Gynecology

## 2017-02-06 DIAGNOSIS — R911 Solitary pulmonary nodule: Secondary | ICD-10-CM

## 2017-02-27 ENCOUNTER — Ambulatory Visit: Payer: Medicare HMO

## 2017-03-04 ENCOUNTER — Ambulatory Visit: Payer: Medicare HMO | Attending: Obstetrics and Gynecology

## 2017-03-05 ENCOUNTER — Other Ambulatory Visit: Payer: Self-pay | Admitting: Obstetrics and Gynecology

## 2017-03-05 ENCOUNTER — Ambulatory Visit
Admission: RE | Admit: 2017-03-05 | Discharge: 2017-03-05 | Disposition: A | Discharge status: Home / Self Care | Payer: Medicare HMO | Source: Ambulatory Visit | Attending: Obstetrics and Gynecology | Admitting: Obstetrics and Gynecology

## 2017-03-05 DIAGNOSIS — J439 Emphysema, unspecified: Secondary | ICD-10-CM | POA: Insufficient documentation

## 2017-03-05 DIAGNOSIS — I700 Atherosclerosis of aorta: Secondary | ICD-10-CM | POA: Diagnosis not present

## 2017-03-05 DIAGNOSIS — Z1211 Encounter for screening for malignant neoplasm of colon: Secondary | ICD-10-CM | POA: Diagnosis not present

## 2017-03-05 DIAGNOSIS — E278 Other specified disorders of adrenal gland: Secondary | ICD-10-CM | POA: Diagnosis not present

## 2017-03-05 DIAGNOSIS — R7309 Other abnormal glucose: Secondary | ICD-10-CM | POA: Diagnosis not present

## 2017-03-05 DIAGNOSIS — R911 Solitary pulmonary nodule: Secondary | ICD-10-CM

## 2017-03-05 DIAGNOSIS — R7611 Nonspecific reaction to tuberculin skin test without active tuberculosis: Secondary | ICD-10-CM | POA: Diagnosis not present

## 2017-03-05 DIAGNOSIS — R918 Other nonspecific abnormal finding of lung field: Secondary | ICD-10-CM | POA: Diagnosis not present

## 2017-03-05 DIAGNOSIS — F17200 Nicotine dependence, unspecified, uncomplicated: Secondary | ICD-10-CM | POA: Diagnosis not present

## 2017-05-06 ENCOUNTER — Ambulatory Visit: Payer: Medicare HMO | Admitting: Gastroenterology

## 2017-05-09 ENCOUNTER — Ambulatory Visit: Payer: Medicare HMO | Admitting: Gastroenterology

## 2017-05-14 ENCOUNTER — Other Ambulatory Visit: Payer: Self-pay

## 2017-05-14 ENCOUNTER — Ambulatory Visit (INDEPENDENT_AMBULATORY_CARE_PROVIDER_SITE_OTHER): Payer: Medicare HMO | Admitting: Gastroenterology

## 2017-05-14 ENCOUNTER — Encounter: Payer: Self-pay | Admitting: Gastroenterology

## 2017-05-14 ENCOUNTER — Encounter (INDEPENDENT_AMBULATORY_CARE_PROVIDER_SITE_OTHER): Payer: Self-pay

## 2017-05-14 VITALS — BP 152/72 | HR 65 | Temp 98.0°F | Ht 71.0 in | Wt 227.2 lb

## 2017-05-14 DIAGNOSIS — K625 Hemorrhage of anus and rectum: Secondary | ICD-10-CM

## 2017-05-14 DIAGNOSIS — R195 Other fecal abnormalities: Secondary | ICD-10-CM | POA: Diagnosis not present

## 2017-05-14 NOTE — Progress Notes (Addendum)
Vonda Antigua, MD [address] [place], [place], [place], [address] [address], [place], [place], [place], [address] Phone: [phone]  Fax: [phone]  Consultation  Referring Provider:     [facility] [place] Physician:  [facility] [place] Primary Gastroenterologist:  Virgel Manifold, MD        Reason for Consultation:     Positive FOBT  Date of Consultation:  05/14/2017         HPI:   John Vang is a 76 y.o. male referred for positive FOBT.  Patient has never had a colonoscopy, and FOBT was done as part of his regular annual exams.  Patient states when he collected the stool sample he had seen bright red blood on the toilet paper that day due to his hemorrhoids.  He has never had bright red blood prior to or after that.  No weight loss, no abdominal pain, no nausea vomiting.  No family history of colon cancer.  Does not take NSAIDs,  Besides aspirin.  Patient was recently seen in the hospital in February 2018 for SVT and started on metoprolol by cardiology.  Troponin was mildly elevated during the admission.  Stress test in May 2018 was normal.  Patient also reports an episode of dizziness 5 months ago, care everywhere shows that he was seen for this by Dr. Marcelline Deist but I am not able to access any notes from that visit except for consideration of starting meclizine.  MRI brain was negative for acute abnormalities in November 2017.  Showed mild cerebral volume loss, mild amount of cerebral white matter hyperintense lesions, sequela of chronic microvascular ischemic change.  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  Patient reports good appetite.  Past Medical History:  Diagnosis Date  . High cholesterol     History reviewed. No pertinent surgical history.  Prior to Admission medications   Medication Sig Start Date End Date Taking? Authorizing Provider  aspirin EC 81 MG tablet Take 81 mg by mouth daily.   Yes [provider]  atorvastatin (LIPITOR) 20 MG tablet Take 1 tablet by mouth daily. 07/04/16  Yes [provider]  metoprolol tartrate (LOPRESSOR) 25 MG tablet Take 0.5 tablets (12.5 mg total) by mouth 2 (two) times daily. 07/29/16  Yes Vaughan Basta, MD    Family History  Problem Relation Age of Onset  . CAD Father   . Cancer Sister   . Stroke Brother      Social History   Tobacco Use  . Smoking status: Current Every Day Smoker    Packs/day: 1.00    Years: 50.00    Pack years: 50.00    Types: Cigarettes  . Smokeless tobacco: Never Used  Substance Use Topics  . Alcohol use: No  . Drug use: No    Allergies as of 05/14/2017  . (No Known Allergies)    Review of Systems:    All systems reviewed and negative except where noted in HPI.   Physical Exam:  Vital signs in last 24 hours: @VSRANGES @ Vitals:   05/14/17 0953  BP: (!) 152/72  Pulse: 65  Temp: 98 F (36.7 C)  TempSrc: Oral  Weight: 103.1 kg (227 lb 3.2 oz)  Height: 5\' 11"  (1.803 m)     General:   Pleasant, cooperative in NAD Head:  Normocephalic and atraumatic. Eyes:   No icterus.   Conjunctiva pink. PERRLA. Ears:  Normal auditory acuity. Neck:  Supple; no masses or thyroidomegaly Lungs:  Respirations even and unlabored. Lungs clear to auscultation bilaterally.   No wheezes, crackles, or rhonchi.  Heart:  Regular rate and rhythm;  Without murmur, clicks, rubs or gallops Abdomen:  Soft, nondistended, nontender. Normal bowel sounds. No appreciable masses or hepatomegaly.  No rebound or guarding.  Neurologic:  Alert and oriented x3;  grossly normal neurologically. Skin:  Intact without significant lesions or rashes. Cervical Nodes:  No significant cervical adenopathy. Psych:  Alert and cooperative. Normal affect.  LAB RESULTS: No results for input(s): WBC, HGB, HCT, PLT in the last 72 hours. BMET No results for input(s): NA, K, CL, CO2, GLUCOSE, BUN, CREATININE, CALCIUM in the last 72 hours. LFT No  results for input(s): PROT, ALBUMIN, AST, ALT, ALKPHOS, BILITOT, BILIDIR, IBILI in the last 72 hours. PT/INR No results for input(s): LABPROT, INR in the last 72 hours.   07/2016 labs reviewed and did not show an anemia at the time  STUDIES: No results found.    Impression / Plan:   John Vang is a 76 y.o. y/o male with positive FOBT on annual exam, with no previous history of colonoscopies, no family history of colon cancer, no alarm symptoms present  Given patient's age and recent SVT, with Institution of new medications in February 2018,  patient would be higher risk for endoscopic procedures with sedation  Would recommend CT colonoscopy at this time to evaluate for any colon cancers or masses.  As this would allow evaluation of his colon without the risk of sedation in this elderly patient. The alternative of undergoing a colonoscopy after obtaining cardiology clearance was also discussed with the patient and the risks of the procedure was also discussed with him.  Patient chooses to undergo the CT colonoscopy instead which is reasonable. If the CT colonoscopy is positive for any lesions or masses, and a colonoscopy is required, he will require cardiology clearance prior to the procedure Will refer for CT colonoscopy at this time We will obtain CBC, CMP has no labs are available since his episode of bright red blood per rectum and positive FOBT Recommend high-fiber diet  BRBPR has resolved and was likely due to hemorrhoids. CT colonoscopy would allow to rule out any large lesions or cancers.  Avoid NSAIDs    thank you for involving me in the care of this patient.     Virgel Manifold, MD  05/14/2017, 10:23 AM

## 2017-05-14 NOTE — Patient Instructions (Signed)
Please start a high fiber diet.   You will be scheduled for a CT colonoscopy in [place]. I will contact you with this information.   Labs have been ordered today. Please go to any [facility] draw station.   If you have any questions or concerns, please contact our office at [phone].

## 2017-08-15 ENCOUNTER — Telehealth: Payer: Self-pay | Admitting: Gastroenterology

## 2017-08-15 NOTE — Telephone Encounter (Signed)
Spoke to patient about recall appt. He will call back after he speaks to his granddaughter.

## 2018-06-05 DIAGNOSIS — R030 Elevated blood-pressure reading, without diagnosis of hypertension: Secondary | ICD-10-CM | POA: Diagnosis not present

## 2018-06-05 DIAGNOSIS — R7309 Other abnormal glucose: Secondary | ICD-10-CM | POA: Diagnosis not present

## 2018-06-05 DIAGNOSIS — R911 Solitary pulmonary nodule: Secondary | ICD-10-CM | POA: Diagnosis not present

## 2018-06-05 DIAGNOSIS — Z1389 Encounter for screening for other disorder: Secondary | ICD-10-CM | POA: Diagnosis not present

## 2018-06-05 DIAGNOSIS — Z23 Encounter for immunization: Secondary | ICD-10-CM | POA: Diagnosis not present

## 2018-06-05 DIAGNOSIS — R195 Other fecal abnormalities: Secondary | ICD-10-CM | POA: Diagnosis not present

## 2018-06-05 DIAGNOSIS — Z0000 Encounter for general adult medical examination without abnormal findings: Secondary | ICD-10-CM | POA: Diagnosis not present

## 2018-06-10 ENCOUNTER — Other Ambulatory Visit: Payer: Self-pay | Admitting: Obstetrics and Gynecology

## 2018-06-10 DIAGNOSIS — R911 Solitary pulmonary nodule: Secondary | ICD-10-CM

## 2018-12-28 IMAGING — DX DG CHEST 1V PORT
1 series · 1 of 1 positions shown · non-contrast
Comparison: 04/09/2009

CLINICAL DATA: Tachycardia.  SVT.

EXAM:
PORTABLE CHEST 1 VIEW

[chest ap]
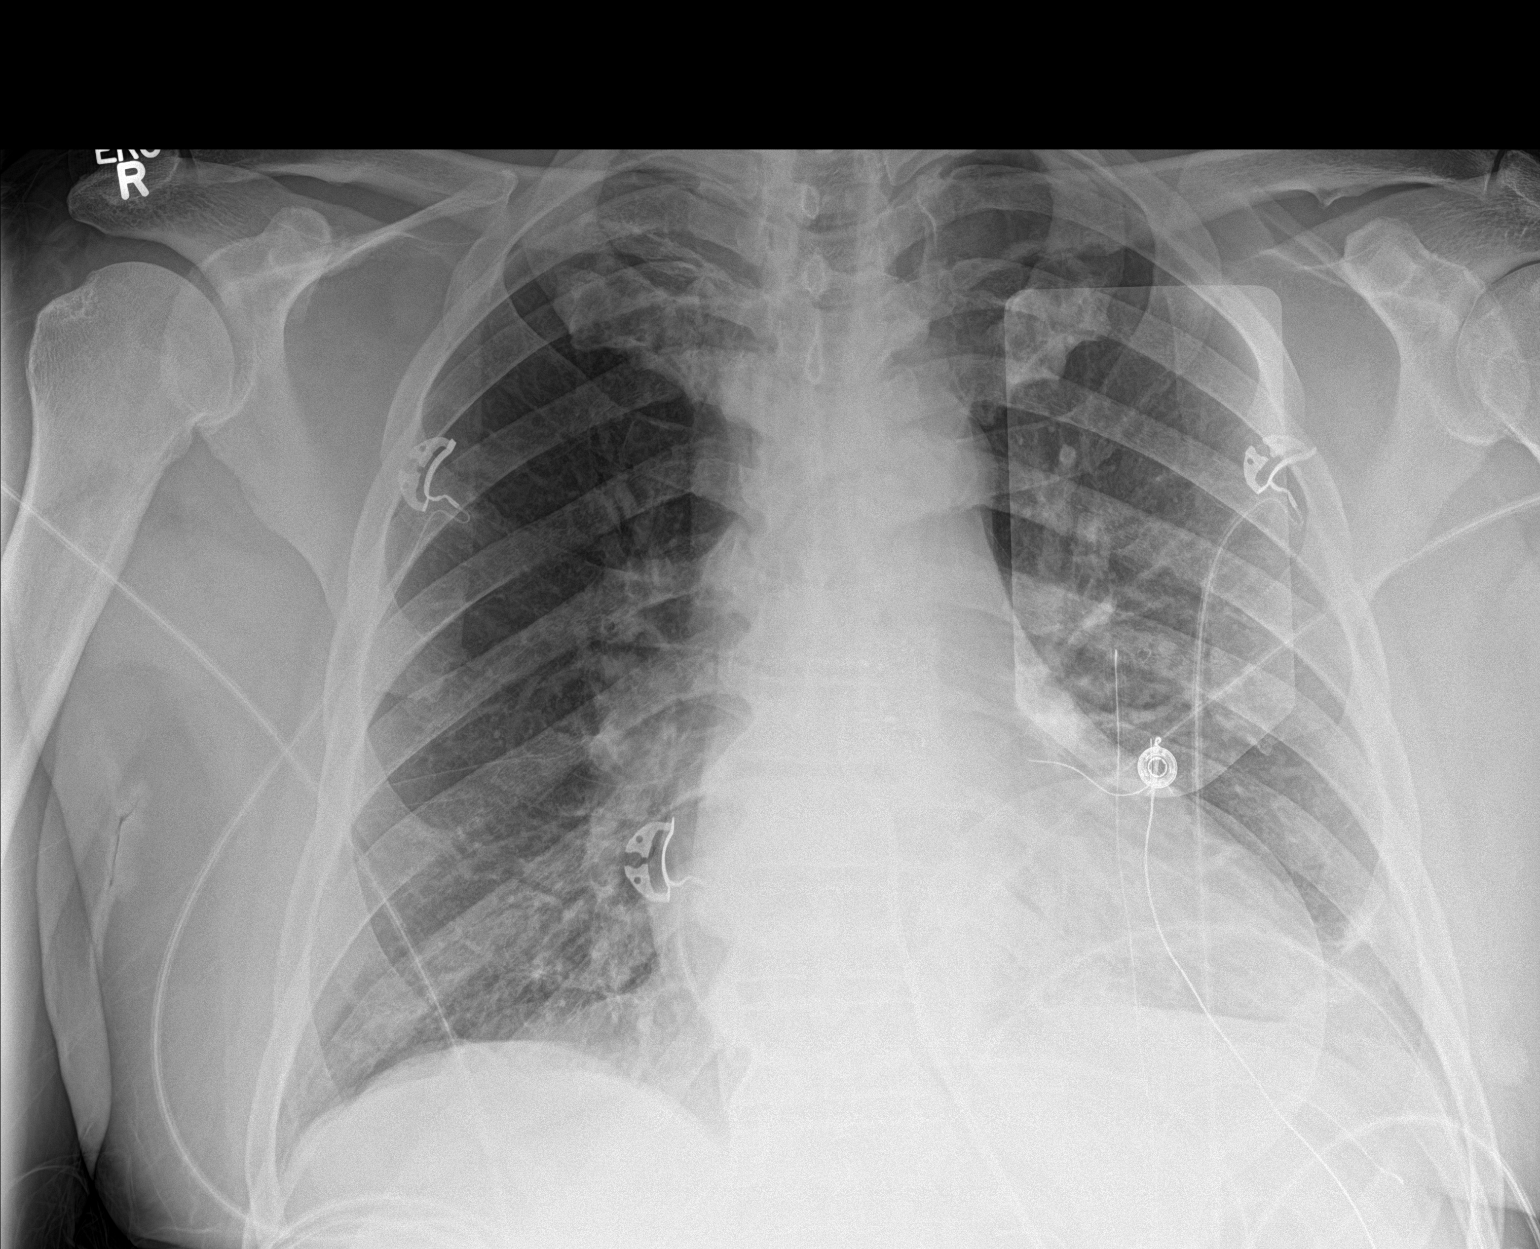

[1 of 1 positions shown; findings below may reference images not displayed]

FINDINGS: Multiple overlying monitoring devices project over the left chest
partially obscuring evaluation. Heart is at the upper limits of
normal in size. Mediastinal contours are normal. Atherosclerosis of
the thoracic aorta. Lingular scarring. No pulmonary edema. No focal
airspace disease. No evidence of pleural fluid. No pneumothorax. No
acute osseous abnormalities are seen.
IMPRESSION: Heart at the upper limits of normal in size. Thoracic aortic
atherosclerosis.

## 2019-05-18 DIAGNOSIS — R911 Solitary pulmonary nodule: Secondary | ICD-10-CM | POA: Diagnosis not present

## 2019-05-18 DIAGNOSIS — Z23 Encounter for immunization: Secondary | ICD-10-CM | POA: Diagnosis not present

## 2019-05-18 DIAGNOSIS — I10 Essential (primary) hypertension: Secondary | ICD-10-CM | POA: Diagnosis not present

## 2019-05-18 DIAGNOSIS — F17200 Nicotine dependence, unspecified, uncomplicated: Secondary | ICD-10-CM | POA: Diagnosis not present

## 2019-05-18 DIAGNOSIS — N182 Chronic kidney disease, stage 2 (mild): Secondary | ICD-10-CM | POA: Diagnosis not present

## 2019-05-18 DIAGNOSIS — R195 Other fecal abnormalities: Secondary | ICD-10-CM | POA: Diagnosis not present

## 2019-05-18 DIAGNOSIS — R7309 Other abnormal glucose: Secondary | ICD-10-CM | POA: Diagnosis not present

## 2019-05-27 DIAGNOSIS — F17200 Nicotine dependence, unspecified, uncomplicated: Secondary | ICD-10-CM | POA: Diagnosis not present

## 2019-05-27 DIAGNOSIS — N182 Chronic kidney disease, stage 2 (mild): Secondary | ICD-10-CM | POA: Diagnosis not present

## 2019-05-27 DIAGNOSIS — I10 Essential (primary) hypertension: Secondary | ICD-10-CM | POA: Diagnosis not present

## 2019-06-03 ENCOUNTER — Other Ambulatory Visit: Payer: Self-pay | Admitting: Obstetrics and Gynecology

## 2019-06-03 DIAGNOSIS — R911 Solitary pulmonary nodule: Secondary | ICD-10-CM

## 2019-06-03 DIAGNOSIS — R7309 Other abnormal glucose: Secondary | ICD-10-CM | POA: Diagnosis not present

## 2019-06-03 DIAGNOSIS — I10 Essential (primary) hypertension: Secondary | ICD-10-CM | POA: Diagnosis not present

## 2019-06-04 DIAGNOSIS — F17200 Nicotine dependence, unspecified, uncomplicated: Secondary | ICD-10-CM | POA: Diagnosis not present

## 2019-06-04 DIAGNOSIS — I10 Essential (primary) hypertension: Secondary | ICD-10-CM | POA: Diagnosis not present

## 2019-06-16 ENCOUNTER — Other Ambulatory Visit: Payer: Self-pay

## 2019-06-16 ENCOUNTER — Ambulatory Visit
Admission: RE | Admit: 2019-06-16 | Discharge: 2019-06-16 | Disposition: A | Discharge status: Home / Self Care | Payer: Medicare HMO | Source: Ambulatory Visit | Attending: Obstetrics and Gynecology | Admitting: Obstetrics and Gynecology

## 2019-06-16 DIAGNOSIS — R911 Solitary pulmonary nodule: Secondary | ICD-10-CM | POA: Diagnosis not present

## 2019-06-19 IMAGING — CR DG CHEST 2V
1 series · 2 of 2 positions shown · non-contrast
Comparison: 07/29/2016

CLINICAL DATA: Positive PPD

EXAM:
CHEST  2 VIEW

[Series 1: dg chest 2 view · 0.14mm/px · 2 of 2 slices shown]
[im 1/2]
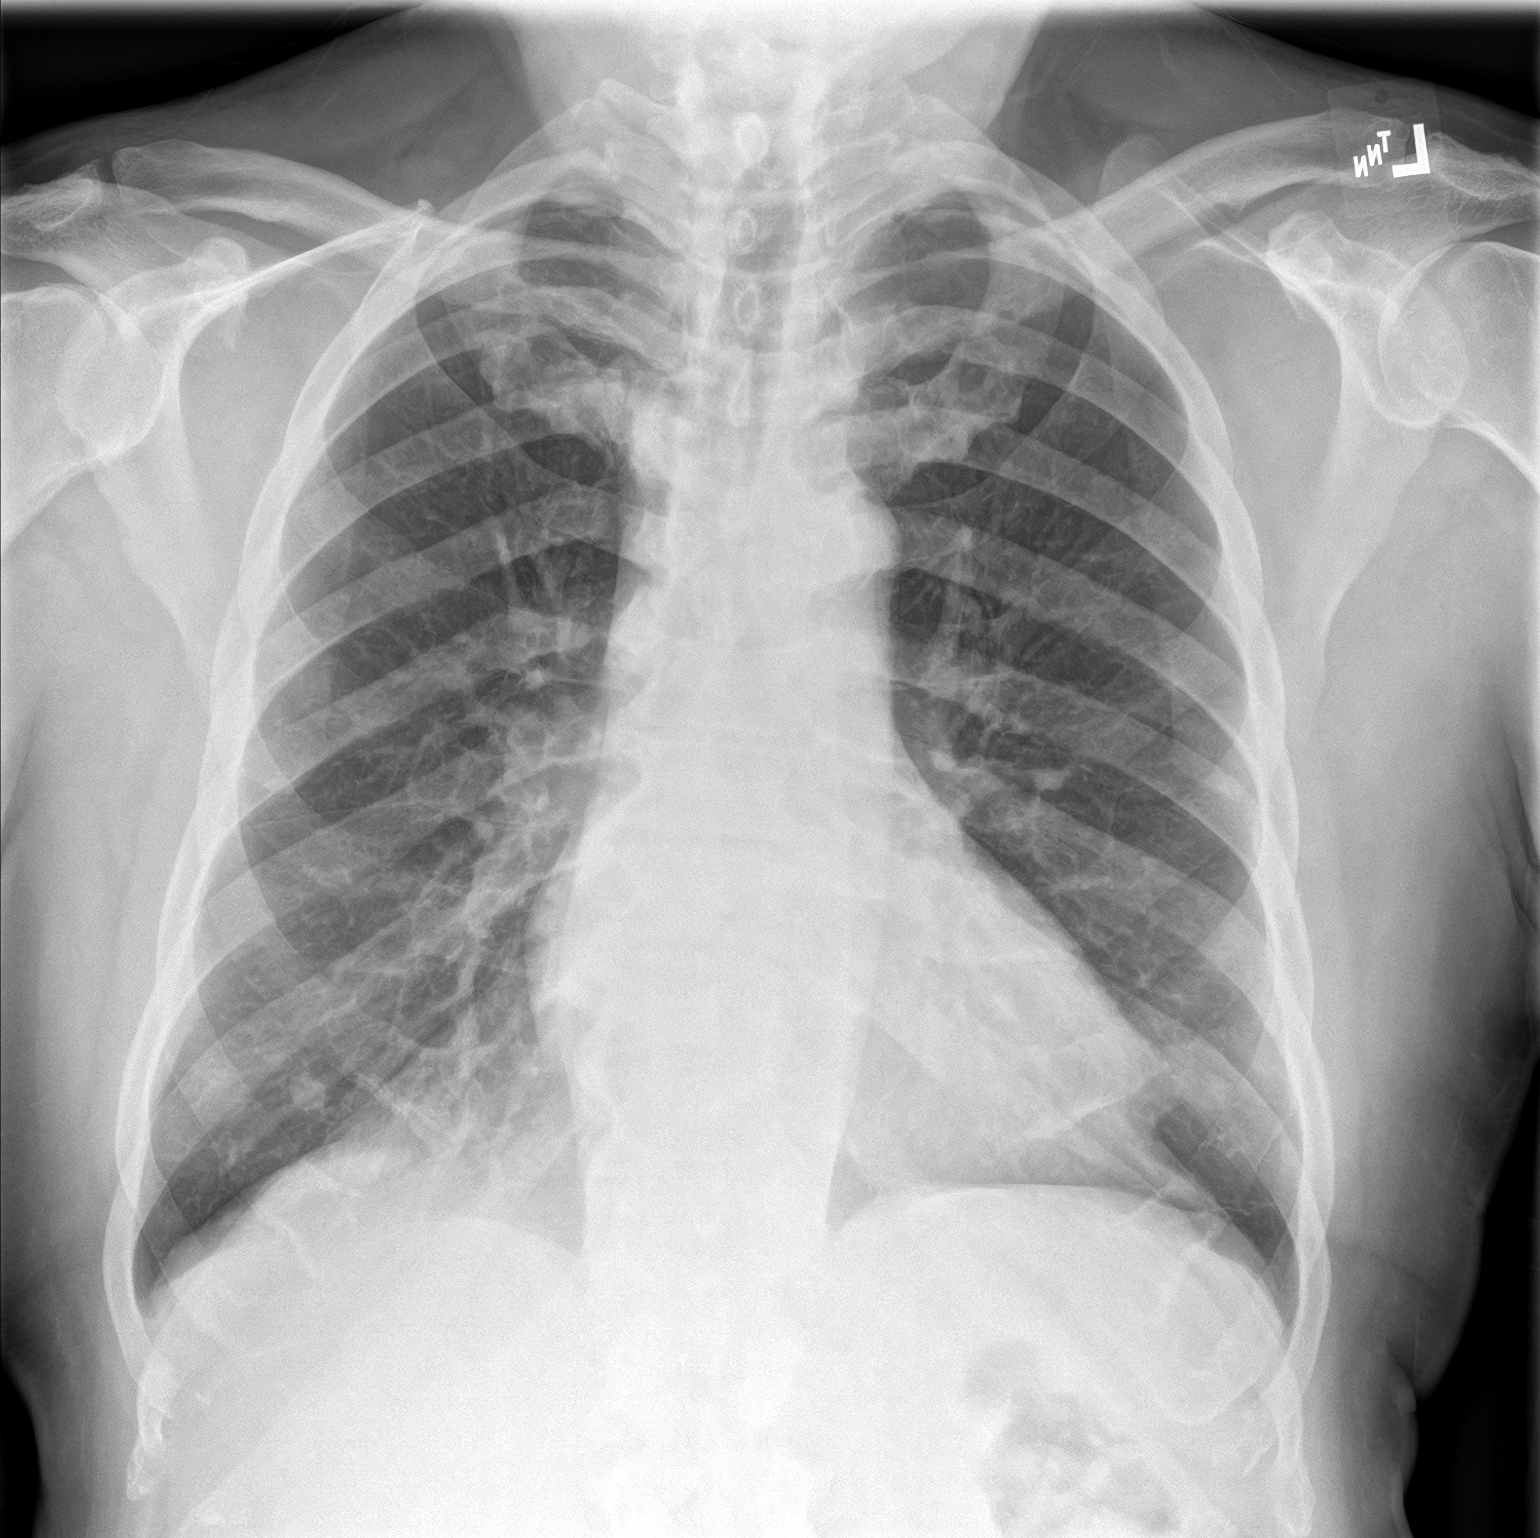
[im 2/2]
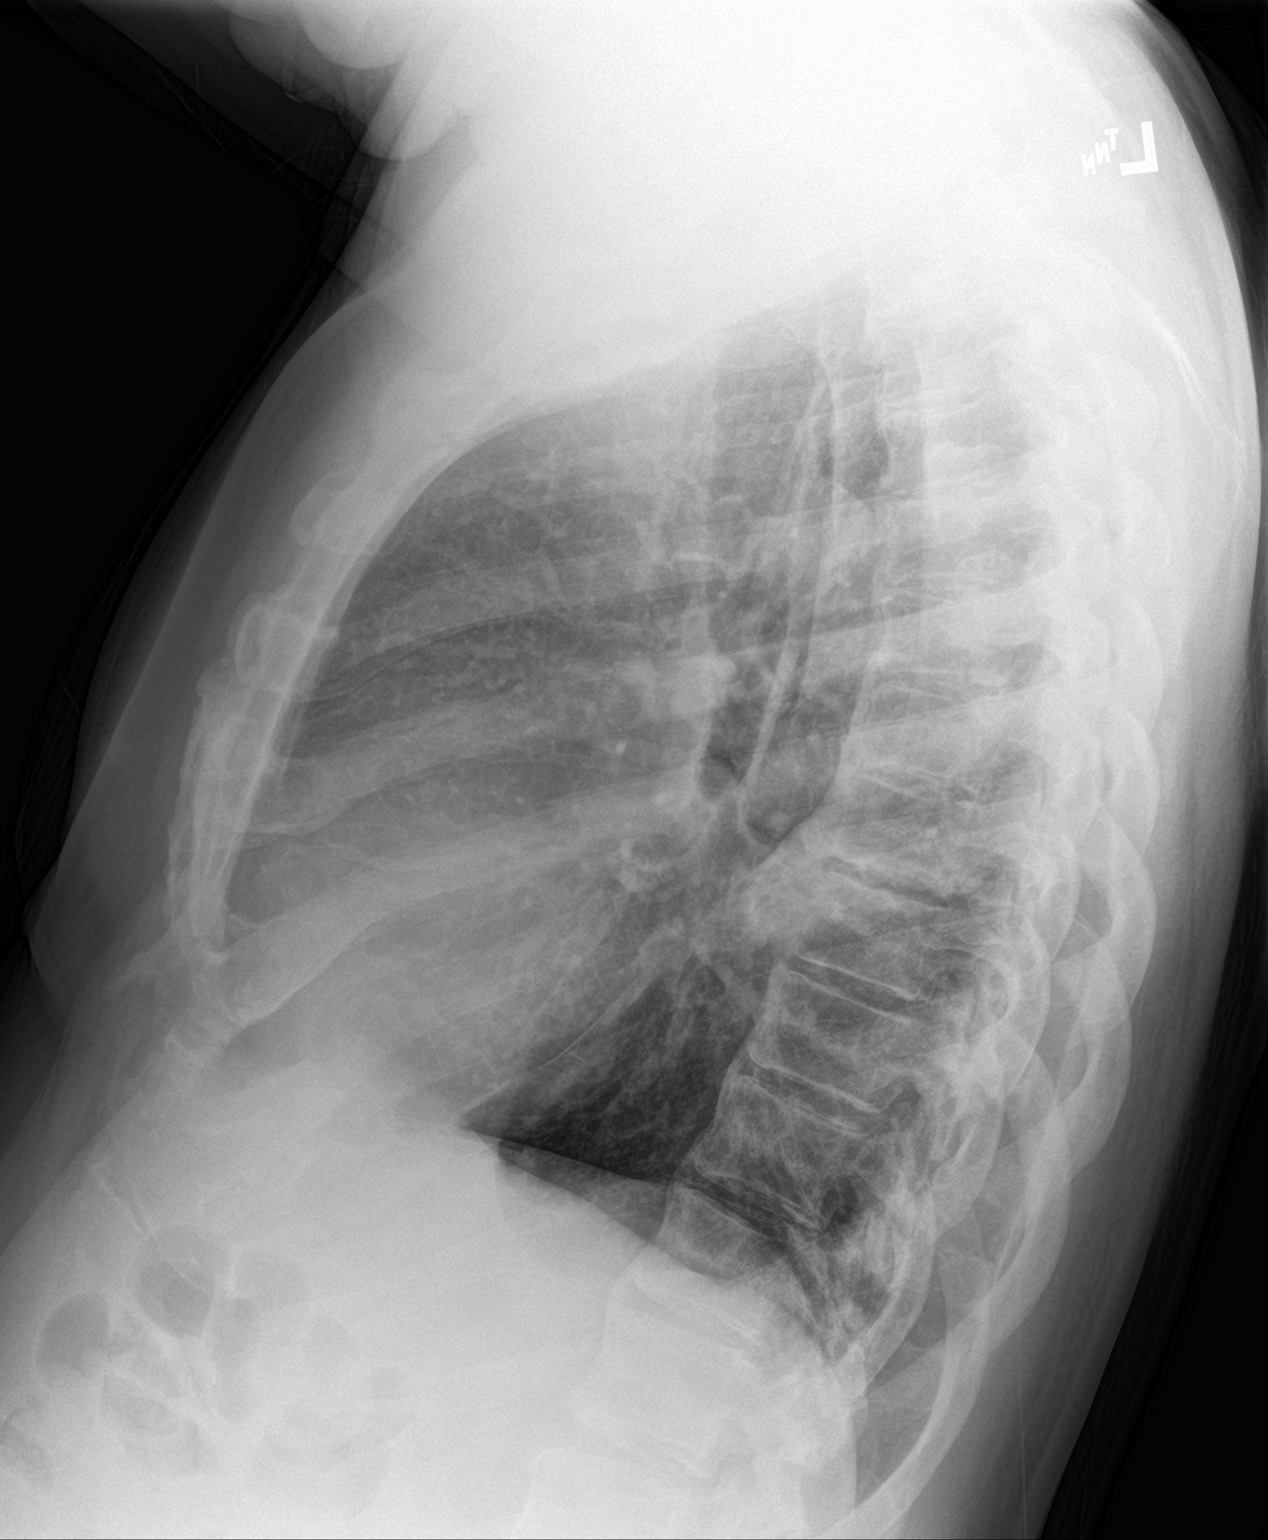

[2 of 2 positions shown; findings below may reference images not displayed]

FINDINGS: Mild hyperinflation. Heart is normal size. Nodular density projects
over the right lower lung, not seen on prior study. Lungs otherwise
clear. No effusions.
IMPRESSION: Question right basilar nodule. This could be further evaluated with
chest CT without IV contrast.

## 2019-07-02 DIAGNOSIS — I10 Essential (primary) hypertension: Secondary | ICD-10-CM | POA: Diagnosis not present

## 2019-07-02 DIAGNOSIS — N182 Chronic kidney disease, stage 2 (mild): Secondary | ICD-10-CM | POA: Diagnosis not present

## 2019-08-19 DIAGNOSIS — N182 Chronic kidney disease, stage 2 (mild): Secondary | ICD-10-CM | POA: Diagnosis not present

## 2019-08-19 DIAGNOSIS — I10 Essential (primary) hypertension: Secondary | ICD-10-CM | POA: Diagnosis not present

## 2019-08-27 DIAGNOSIS — Z23 Encounter for immunization: Secondary | ICD-10-CM | POA: Diagnosis not present

## 2019-08-27 DIAGNOSIS — I10 Essential (primary) hypertension: Secondary | ICD-10-CM | POA: Diagnosis not present

## 2019-08-27 DIAGNOSIS — I519 Heart disease, unspecified: Secondary | ICD-10-CM | POA: Diagnosis not present

## 2019-09-18 DIAGNOSIS — I739 Peripheral vascular disease, unspecified: Secondary | ICD-10-CM | POA: Diagnosis not present

## 2019-09-18 DIAGNOSIS — E785 Hyperlipidemia, unspecified: Secondary | ICD-10-CM | POA: Diagnosis not present

## 2019-09-18 DIAGNOSIS — M1990 Unspecified osteoarthritis, unspecified site: Secondary | ICD-10-CM | POA: Diagnosis not present

## 2019-09-18 DIAGNOSIS — Z9852 Vasectomy status: Secondary | ICD-10-CM | POA: Diagnosis not present

## 2019-09-18 DIAGNOSIS — F17200 Nicotine dependence, unspecified, uncomplicated: Secondary | ICD-10-CM | POA: Diagnosis not present

## 2019-09-18 DIAGNOSIS — N184 Chronic kidney disease, stage 4 (severe): Secondary | ICD-10-CM | POA: Diagnosis not present

## 2019-09-18 DIAGNOSIS — Z6832 Body mass index (BMI) 32.0-32.9, adult: Secondary | ICD-10-CM | POA: Diagnosis not present

## 2019-09-18 DIAGNOSIS — I129 Hypertensive chronic kidney disease with stage 1 through stage 4 chronic kidney disease, or unspecified chronic kidney disease: Secondary | ICD-10-CM | POA: Diagnosis not present

## 2019-09-18 DIAGNOSIS — E669 Obesity, unspecified: Secondary | ICD-10-CM | POA: Diagnosis not present

## 2019-09-30 DIAGNOSIS — I10 Essential (primary) hypertension: Secondary | ICD-10-CM | POA: Diagnosis not present

## 2019-09-30 DIAGNOSIS — N182 Chronic kidney disease, stage 2 (mild): Secondary | ICD-10-CM | POA: Diagnosis not present

## 2019-09-30 DIAGNOSIS — Z23 Encounter for immunization: Secondary | ICD-10-CM | POA: Diagnosis not present

## 2019-12-08 DIAGNOSIS — I10 Essential (primary) hypertension: Secondary | ICD-10-CM | POA: Diagnosis not present

## 2020-09-02 ENCOUNTER — Telehealth: Payer: Self-pay | Admitting: *Deleted

## 2020-09-02 NOTE — Telephone Encounter (Signed)
Attempted to contact to schedule lung screening scan. However there is no answer or voicemail option available.

## 2020-09-26 ENCOUNTER — Telehealth: Payer: Self-pay | Admitting: *Deleted

## 2020-09-26 NOTE — Telephone Encounter (Signed)
Attempted to contact to schedule lung screening scan. However there is no answer or voicemail option available. dhs

## 2020-09-27 ENCOUNTER — Telehealth: Payer: Self-pay | Admitting: *Deleted

## 2020-09-27 ENCOUNTER — Other Ambulatory Visit: Payer: Self-pay | Admitting: *Deleted

## 2020-09-27 DIAGNOSIS — Z122 Encounter for screening for malignant neoplasm of respiratory organs: Secondary | ICD-10-CM

## 2020-09-27 DIAGNOSIS — Z87891 Personal history of nicotine dependence: Secondary | ICD-10-CM

## 2020-09-27 DIAGNOSIS — F17200 Nicotine dependence, unspecified, uncomplicated: Secondary | ICD-10-CM

## 2020-09-27 NOTE — Telephone Encounter (Signed)
Received referral for initial lung cancer screening scan. Contacted patient and obtained smoking history,(current every day smoker, 1/2 ppd x 50 yrs) as well as answering questions related to screening process. Patient denies signs of lung cancer such as weight loss or hemoptysis. Patient denies comorbidity that would prevent curative treatment if lung cancer were found. Patient is scheduled for shared decision making visit and CT scan on 10/12/20 @ 10:30 am.

## 2020-10-12 ENCOUNTER — Inpatient Hospital Stay (HOSPITAL_BASED_OUTPATIENT_CLINIC_OR_DEPARTMENT_OTHER): Payer: Medicare HMO | Admitting: Oncology

## 2020-10-12 ENCOUNTER — Other Ambulatory Visit: Payer: Self-pay

## 2020-10-12 ENCOUNTER — Ambulatory Visit
Admission: RE | Admit: 2020-10-12 | Discharge: 2020-10-12 | Disposition: A | Discharge status: Home / Self Care | Payer: Medicare HMO | Source: Ambulatory Visit | Attending: Oncology | Admitting: Oncology

## 2020-10-12 ENCOUNTER — Encounter: Payer: Self-pay | Admitting: Oncology

## 2020-10-12 DIAGNOSIS — F17200 Nicotine dependence, unspecified, uncomplicated: Secondary | ICD-10-CM

## 2020-10-12 DIAGNOSIS — F17210 Nicotine dependence, cigarettes, uncomplicated: Secondary | ICD-10-CM | POA: Insufficient documentation

## 2020-10-12 DIAGNOSIS — J439 Emphysema, unspecified: Secondary | ICD-10-CM | POA: Diagnosis not present

## 2020-10-12 DIAGNOSIS — I700 Atherosclerosis of aorta: Secondary | ICD-10-CM | POA: Diagnosis not present

## 2020-10-12 DIAGNOSIS — Z122 Encounter for screening for malignant neoplasm of respiratory organs: Secondary | ICD-10-CM | POA: Insufficient documentation

## 2020-10-12 DIAGNOSIS — I2510 Atherosclerotic heart disease of native coronary artery without angina pectoris: Secondary | ICD-10-CM | POA: Insufficient documentation

## 2020-10-12 DIAGNOSIS — Z87891 Personal history of nicotine dependence: Secondary | ICD-10-CM

## 2020-10-12 NOTE — Progress Notes (Signed)
Virtual Visit via Video Note  I connected with John Vang on 10/12/20 at 10:30 AM EDT by a video enabled telemedicine application and verified that I am speaking with the correct person using two identifiers.  Location: Patient: [facility]  Provider: Clinic    I discussed the limitations of evaluation and management by telemedicine and the availability of in person appointments. The patient expressed understanding and agreed to proceed.  I discussed the assessment and treatment plan with the patient. The patient was provided an opportunity to ask questions and all were answered. The patient agreed with the plan and demonstrated an understanding of the instructions.   The patient was advised to call back or seek an in-person evaluation if the symptoms worsen or if the condition fails to improve as anticipated.   In accordance with CMS guidelines, patient has met eligibility criteria including age, absence of signs or symptoms of lung cancer.  Social History   Tobacco Use  . Smoking status: Current Every Day Smoker    Packs/day: 0.50    Years: 50.00    Pack years: 25.00    Types: Cigarettes  . Smokeless tobacco: Never Used  Substance Use Topics  . Alcohol use: No  . Drug use: No      A shared decision-making session was conducted prior to the performance of CT scan. This includes one or more decision aids, includes benefits and harms of screening, follow-up diagnostic testing, over-diagnosis, false positive rate, and total radiation exposure.   Counseling on the importance of adherence to annual lung cancer LDCT screening, impact of co-morbidities, and ability or willingness to undergo diagnosis and treatment is imperative for compliance of the program.   Counseling on the importance of continued smoking cessation for former smokers; the importance of smoking cessation for current smokers, and information about tobacco cessation interventions have been given to patient including [facility] and [facility] quit [place] programs.   Written order for lung cancer screening with LDCT has been given to the patient and any and all questions have been answered to the best of my abilities.    Yearly follow up will be coordinated by Burgess Estelle, Thoracic Navigator.  I provided 15 minutes of face-to-face video visit time during this encounter, and > 50% was spent counseling as documented under my assessment & plan.   Jacquelin Hawking, NP

## 2020-10-20 ENCOUNTER — Encounter: Payer: Self-pay | Admitting: *Deleted

## 2021-10-27 ENCOUNTER — Other Ambulatory Visit: Payer: Self-pay

## 2021-10-27 ENCOUNTER — Telehealth: Payer: Self-pay | Admitting: Acute Care

## 2021-10-27 DIAGNOSIS — Z122 Encounter for screening for malignant neoplasm of respiratory organs: Secondary | ICD-10-CM

## 2021-10-27 DIAGNOSIS — F17200 Nicotine dependence, unspecified, uncomplicated: Secondary | ICD-10-CM

## 2021-10-27 DIAGNOSIS — Z87891 Personal history of nicotine dependence: Secondary | ICD-10-CM

## 2021-10-27 NOTE — Telephone Encounter (Signed)
Attempted to reach pt to schedule LDCT scan.  Phone rang >10 time and then went to a busy signal. ? ?Ordered enter to see if insurance would pay with pt 81 yrs old.  It was accepted. Please note if pt is called back. ?

## 2021-11-10 ENCOUNTER — Encounter: Payer: Self-pay | Admitting: *Deleted

## 2021-11-10 NOTE — Telephone Encounter (Signed)
Attempted to call pt again to schedule f/u lung screening CT scan but was unable to leave a message. Letter mailed to pt asking him to contact office to schedule.

## 2023-01-17 DIAGNOSIS — M7989 Other specified soft tissue disorders: Secondary | ICD-10-CM | POA: Diagnosis not present

## 2023-01-17 DIAGNOSIS — R7309 Other abnormal glucose: Secondary | ICD-10-CM | POA: Diagnosis not present

## 2023-01-17 DIAGNOSIS — Z0000 Encounter for general adult medical examination without abnormal findings: Secondary | ICD-10-CM | POA: Diagnosis not present

## 2023-01-17 DIAGNOSIS — I10 Essential (primary) hypertension: Secondary | ICD-10-CM | POA: Diagnosis not present

## 2024-03-10 DIAGNOSIS — Z6831 Body mass index (BMI) 31.0-31.9, adult: Secondary | ICD-10-CM | POA: Diagnosis not present

## 2024-03-10 DIAGNOSIS — E785 Hyperlipidemia, unspecified: Secondary | ICD-10-CM | POA: Diagnosis not present

## 2024-03-10 DIAGNOSIS — F17210 Nicotine dependence, cigarettes, uncomplicated: Secondary | ICD-10-CM | POA: Diagnosis not present

## 2024-03-10 DIAGNOSIS — Z008 Encounter for other general examination: Secondary | ICD-10-CM | POA: Diagnosis not present

## 2024-03-10 DIAGNOSIS — E669 Obesity, unspecified: Secondary | ICD-10-CM | POA: Diagnosis not present

## 2024-03-10 DIAGNOSIS — I10 Essential (primary) hypertension: Secondary | ICD-10-CM | POA: Diagnosis not present
